# Patient Record
Sex: Male | Born: 1955 | Race: Black or African American | Hispanic: No | Marital: Single | Smoking: Current some day smoker
Health system: Southern US, Community
[De-identification: ages and names within clinical notes are randomized; demographics above are authoritative.]

## PROBLEM LIST (undated history)

## (undated) DIAGNOSIS — K219 Gastro-esophageal reflux disease without esophagitis: Secondary | ICD-10-CM

## (undated) HISTORY — PX: HERNIA REPAIR: SHX51

---

## 2017-02-11 ENCOUNTER — Encounter (HOSPITAL_COMMUNITY): Payer: Self-pay | Admitting: Emergency Medicine

## 2017-02-11 DIAGNOSIS — Y929 Unspecified place or not applicable: Secondary | ICD-10-CM | POA: Insufficient documentation

## 2017-02-11 DIAGNOSIS — Y939 Activity, unspecified: Secondary | ICD-10-CM | POA: Insufficient documentation

## 2017-02-11 DIAGNOSIS — Y999 Unspecified external cause status: Secondary | ICD-10-CM | POA: Diagnosis not present

## 2017-02-11 DIAGNOSIS — F17200 Nicotine dependence, unspecified, uncomplicated: Secondary | ICD-10-CM | POA: Diagnosis not present

## 2017-02-11 DIAGNOSIS — X58XXXA Exposure to other specified factors, initial encounter: Secondary | ICD-10-CM | POA: Insufficient documentation

## 2017-02-11 DIAGNOSIS — S3992XA Unspecified injury of lower back, initial encounter: Secondary | ICD-10-CM | POA: Diagnosis present

## 2017-02-11 DIAGNOSIS — S39012A Strain of muscle, fascia and tendon of lower back, initial encounter: Secondary | ICD-10-CM | POA: Diagnosis not present

## 2017-02-11 LAB — CBC WITH DIFFERENTIAL/PLATELET
BASOS ABS: 0 10*3/uL (ref 0.0–0.1)
BASOS PCT: 0 %
Eosinophils Absolute: 0.3 10*3/uL (ref 0.0–0.7)
Eosinophils Relative: 3 %
HEMATOCRIT: 42.5 % (ref 39.0–52.0)
HEMOGLOBIN: 14 g/dL (ref 13.0–17.0)
Lymphocytes Relative: 25 %
Lymphs Abs: 2.2 10*3/uL (ref 0.7–4.0)
MCH: 29.4 pg (ref 26.0–34.0)
MCHC: 32.9 g/dL (ref 30.0–36.0)
MCV: 89.1 fL (ref 78.0–100.0)
MONOS PCT: 9 %
Monocytes Absolute: 0.8 10*3/uL (ref 0.1–1.0)
NEUTROS ABS: 5.4 10*3/uL (ref 1.7–7.7)
NEUTROS PCT: 63 %
Platelets: 257 10*3/uL (ref 150–400)
RBC: 4.77 MIL/uL (ref 4.22–5.81)
RDW: 13.8 % (ref 11.5–15.5)
WBC: 8.5 10*3/uL (ref 4.0–10.5)

## 2017-02-11 LAB — BASIC METABOLIC PANEL
ANION GAP: 9 (ref 5–15)
BUN: 12 mg/dL (ref 6–20)
CHLORIDE: 104 mmol/L (ref 101–111)
CO2: 25 mmol/L (ref 22–32)
Calcium: 9.1 mg/dL (ref 8.9–10.3)
Creatinine, Ser: 1.07 mg/dL (ref 0.61–1.24)
GFR calc non Af Amer: >60 mL/min (ref >60)
Glucose, Bld: 103 mg/dL — ABNORMAL HIGH (ref 65–99)
Potassium: 4.5 mmol/L (ref 3.5–5.1)
SODIUM: 138 mmol/L (ref 135–145)

## 2017-02-11 NOTE — ED Triage Notes (Signed)
Patient reports right lower back pain radiating down to right upper thigh , denies injury , pain increases with movement / changing positions with intermittent mild dysuria . No hematuria .

## 2017-02-12 ENCOUNTER — Emergency Department (HOSPITAL_COMMUNITY): Payer: Medicare Other

## 2017-02-12 ENCOUNTER — Emergency Department (HOSPITAL_COMMUNITY)
Admission: EM | Admit: 2017-02-12 | Discharge: 2017-02-12 | Disposition: A | Discharge status: Home / Self Care | Payer: Medicare Other | Attending: Emergency Medicine | Admitting: Emergency Medicine

## 2017-02-12 DIAGNOSIS — S39012A Strain of muscle, fascia and tendon of lower back, initial encounter: Secondary | ICD-10-CM

## 2017-02-12 LAB — URINALYSIS, ROUTINE W REFLEX MICROSCOPIC
BILIRUBIN URINE: NEGATIVE
Glucose, UA: NEGATIVE mg/dL
Hgb urine dipstick: NEGATIVE
Ketones, ur: NEGATIVE mg/dL
Leukocytes, UA: NEGATIVE
Nitrite: NEGATIVE
PROTEIN: NEGATIVE mg/dL
Specific Gravity, Urine: 1.02 (ref 1.005–1.030)
pH: 5 (ref 5.0–8.0)

## 2017-02-12 MED ORDER — ONDANSETRON 4 MG PO TBDP
4.0000 mg | ORAL_TABLET | Freq: Once | ORAL | Status: AC
  Administered 2017-02-12: 4 mg via ORAL
  Filled 2017-02-12: qty 1

## 2017-02-12 MED ORDER — OXYCODONE-ACETAMINOPHEN 5-325 MG PO TABS: 1.0000 | ORAL_TABLET | Freq: Four times a day (QID) | ORAL | 0 refills | Status: AC | PRN

## 2017-02-12 MED ORDER — BACLOFEN 10 MG PO TABS
10.0000 mg | ORAL_TABLET | Freq: Once | ORAL | Status: AC
  Administered 2017-02-12: 10 mg via ORAL
  Filled 2017-02-12: qty 1

## 2017-02-12 MED ORDER — OXYCODONE-ACETAMINOPHEN 5-325 MG PO TABS
2.0000 | ORAL_TABLET | Freq: Once | ORAL | Status: AC
  Administered 2017-02-12: 2 via ORAL
  Filled 2017-02-12: qty 2

## 2017-02-12 MED ORDER — BACLOFEN 10 MG PO TABS: 10.0000 mg | ORAL_TABLET | Freq: Three times a day (TID) | ORAL | 0 refills | Status: AC

## 2017-02-12 MED ORDER — MELOXICAM 15 MG PO TABS: 15.0000 mg | ORAL_TABLET | Freq: Every day | ORAL | 0 refills | Status: DC

## 2017-02-12 NOTE — ED Provider Notes (Signed)
[facility] Provider Note   CSN: [id number] Arrival date & time: 02/11/17  1958     History   Chief Complaint Chief Complaint  Patient presents with  . Back Pain    HPI Raymond Daniels is a 61 y.o. male who presents emergency fellow chief complaint of back pain. Patient states that 2 days ago, he was showing his son and exercise where he would twist at the waist when he had sudden onset of severe gripping lower back pain. He states that since that time. He cannot find a comfortable position including lying down or standing. He states that at times the pain grips him with certain movements and radiates down his back and around the front of both of his legs. He denies weakness, numbness or tingling, loss of bowel or bladder control. He has not taken any medication for the pain  HPI  History reviewed. No pertinent past medical history.  There are no active problems to display for this patient.   Past Surgical History:  Procedure Laterality Date  . HERNIA REPAIR         Home Medications    Prior to Admission medications   Not on File    Family History No family history on file.  Social History Social History  Substance Use Topics  . Smoking status: Current Every Day Smoker  . Smokeless tobacco: Never Used  . Alcohol use Yes     Allergies   Patient has no known allergies.   Review of Systems Review of Systems  Ten systems reviewed and are negative for acute change, except as noted in the HPI. \ Physical Exam Updated Vital Signs BP 110/78   Pulse 83   Temp 98.6 F (37 C) (Oral)   Resp 18   Ht 6\' 1"  (1.854 m)   Wt 114.3 kg   SpO2 100%   BMI 33.25 kg/m   Physical Exam  Constitutional: He appears well-developed and well-nourished. No distress.  HENT:  Head: Normocephalic and atraumatic.  Eyes: Conjunctivae are normal. No scleral icterus.  Neck: Normal range of motion. Neck supple.  Cardiovascular: Normal rate, regular rhythm and normal heart  sounds.   Pulmonary/Chest: Effort normal and breath sounds normal. No respiratory distress.  Abdominal: Soft. There is no tenderness.  Genitourinary:  Genitourinary Comments:    Musculoskeletal: He exhibits no edema.  Patient appears to be in mild to moderate pain, antalgic gait noted. Lumbosacral spine area reveals no local tenderness or mass. Painful and reduced LS ROM noted. Straight leg raise is negative. DTR's, motor strength and sensation normal, including heel and toe gait.  Peripheral pulses are palpable.  Neurological: He is alert.  Skin: Skin is warm and dry. He is not diaphoretic.  Psychiatric: His behavior is normal.  Nursing note and vitals reviewed.    ED Treatments / Results  Labs (all labs ordered are listed, but only abnormal results are displayed) Labs Reviewed  BASIC METABOLIC PANEL - Abnormal; Notable for the following:       Result Value   Glucose, Bld 103 (*)    All other components within normal limits  CBC WITH DIFFERENTIAL/PLATELET  URINALYSIS, ROUTINE W REFLEX MICROSCOPIC    EKG  EKG Interpretation None       Radiology No results found.  Procedures Procedures (including critical care time)  Medications Ordered in ED Medications - No data to display   Initial Impression / Assessment and Plan / ED Course  I have reviewed the triage vital signs  and the nursing notes.  Pertinent labs & imaging results that were available during my care of the patient were reviewed by me and considered in my medical decision making (see chart for details).    Patient with back pain.  No neurological deficits and normal neuro exam.  Patient can walk but states is painful.  No loss of bowel or bladder control.  No concern for cauda equina.  No fever, night sweats, weight loss, h/o cancer, IVDU.  RICE protocol and pain medicine indicated and discussed with patient.   Final Clinical Impressions(s) / ED Diagnoses   Final diagnoses:  Strain of lumbar region,  initial encounter    New Prescriptions New Prescriptions   No medications on file     Arthor Captain, PA-C 02/12/17 1610    Tomasita Crumble, MD 02/12/17 [phone]

## 2017-02-12 NOTE — Discharge Instructions (Signed)
SEEK IMMEDIATE MEDICAL ATTENTION IF: New numbness, tingling, weakness, or problem with the use of your arms or legs.  Severe back pain not relieved with medications.  Change in bowel or bladder control.  Increasing pain in any areas of the body (such as chest or abdominal pain).  Shortness of breath, dizziness or fainting.  Nausea (feeling sick to your stomach), vomiting, fever, or sweats.  

## 2018-01-20 ENCOUNTER — Other Ambulatory Visit: Payer: Self-pay | Admitting: Surgery

## 2018-01-20 DIAGNOSIS — R1909 Other intra-abdominal and pelvic swelling, mass and lump: Secondary | ICD-10-CM

## 2018-02-04 ENCOUNTER — Ambulatory Visit
Admission: RE | Admit: 2018-02-04 | Discharge: 2018-02-04 | Disposition: A | Discharge status: Home / Self Care | Payer: Medicare Other | Source: Ambulatory Visit | Attending: Surgery | Admitting: Surgery

## 2018-02-04 DIAGNOSIS — R1909 Other intra-abdominal and pelvic swelling, mass and lump: Secondary | ICD-10-CM

## 2018-02-04 MED ORDER — IOPAMIDOL (ISOVUE-300) INJECTION 61%
100.0000 mL | Freq: Once | INTRAVENOUS | Status: AC | PRN
  Administered 2018-02-04: 100 mL via INTRAVENOUS

## 2019-03-01 ENCOUNTER — Other Ambulatory Visit: Payer: Self-pay | Admitting: Podiatry

## 2019-03-01 ENCOUNTER — Other Ambulatory Visit: Payer: Self-pay

## 2019-03-01 ENCOUNTER — Encounter: Payer: Self-pay | Admitting: Podiatry

## 2019-03-01 ENCOUNTER — Ambulatory Visit (INDEPENDENT_AMBULATORY_CARE_PROVIDER_SITE_OTHER): Payer: Medicare Other

## 2019-03-01 ENCOUNTER — Ambulatory Visit: Payer: Medicare Other | Admitting: Podiatry

## 2019-03-01 VITALS — BP 128/77 | HR 78 | Temp 98.2°F | Resp 16

## 2019-03-01 DIAGNOSIS — M7751 Other enthesopathy of right foot: Secondary | ICD-10-CM

## 2019-03-01 DIAGNOSIS — M7752 Other enthesopathy of left foot: Secondary | ICD-10-CM | POA: Diagnosis not present

## 2019-03-01 DIAGNOSIS — M79674 Pain in right toe(s): Secondary | ICD-10-CM | POA: Diagnosis not present

## 2019-03-01 DIAGNOSIS — M2041 Other hammer toe(s) (acquired), right foot: Secondary | ICD-10-CM

## 2019-03-01 DIAGNOSIS — M2042 Other hammer toe(s) (acquired), left foot: Principal | ICD-10-CM

## 2019-03-01 DIAGNOSIS — M79675 Pain in left toe(s): Secondary | ICD-10-CM

## 2019-03-01 DIAGNOSIS — M779 Enthesopathy, unspecified: Secondary | ICD-10-CM

## 2019-03-01 MED ORDER — TRIAMCINOLONE ACETONIDE 10 MG/ML IJ SUSP
10.0000 mg | Freq: Once | INTRAMUSCULAR | Status: AC
  Administered 2019-03-01: 10:00:00 10 mg

## 2019-03-01 MED ORDER — TRIAMCINOLONE ACETONIDE 10 MG/ML IJ SUSP
10.0000 mg | Freq: Once | INTRAMUSCULAR | Status: AC
  Administered 2019-03-01: 10 mg

## 2019-03-01 NOTE — Progress Notes (Signed)
   Subjective:    Patient ID: Raymond Daniels, male    DOB: [birth year], 63 y.o.   MRN: [id number]  HPI    Review of Systems  All other systems reviewed and are negative.      Objective:   Physical Exam        Assessment & Plan:

## 2019-03-01 NOTE — Progress Notes (Signed)
Subjective:   Patient ID: Raymond Daniels, male   DOB: 63 y.o.   MRN: [id number]   HPI Patient presents stating that she is getting a lot of pain around her toes and is been going on a long time and is worsened over time.  Also states that it seems the big toe joint can become bothersome and states that both of them have become increasingly aggravating and at times he also seems to develop pain in his ankles.  Patient smokes occasionally does not drink and is in reasonably good health  Review of Systems  All other systems reviewed and are negative.       Objective:  Physical Exam Vitals signs and nursing note reviewed.  Constitutional:      Appearance: He is well-developed.  Pulmonary:     Effort: Pulmonary effort is normal.  Musculoskeletal: Normal range of motion.  Skin:    General: Skin is warm.  Neurological:     Mental Status: He is alert.     Neurovascular status intact muscle strength is found to be adequate range of motion is within normal limits except for subtalar motion which is moderately diminished.  There is quite a bit of discomfort around the lesser MPJs bilateral with inflammation noted especially around the first and second MPJ with no restriction of motion and moderate hammertoe deformity noted bilateral with moderate rigid contracture.  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

## 2019-03-01 NOTE — Patient Instructions (Signed)
Hammer Toe  Hammer toe is a change in the shape (a deformity) of your toe. The deformity causes the middle joint of your toe to stay bent. This causes pain, especially when you are wearing shoes. Hammer toe starts gradually. At first, the toe can be straightened. Gradually over time, the deformity becomes stiff and permanent. Early treatments to keep the toe straight may relieve pain. As the deformity becomes stiff and permanent, surgery may be needed to straighten the toe. What are the causes? Hammer toe is caused by abnormal bending of the toe joint that is closest to your foot. It happens gradually over time. This pulls on the muscles and connections (tendons) of the toe joint, making them weak and stiff. It is often related to wearing shoes that are too short or narrow and do not let your toes straighten. What increases the risk? You may be at greater risk for hammer toe if you:  Are male.  Are older.  Wear shoes that are too small.  Wear high-heeled shoes that pinch your toes.  Are a ballet dancer.  Have a second toe that is longer than your big toe (first toe).  Injure your foot or toe.  Have arthritis.  Have a family history of hammer toe.  Have a nerve or muscle disorder. What are the signs or symptoms? The main symptoms of this condition are pain and deformity of the toe. The pain is worse when wearing shoes, walking, or running. Other symptoms may include:  Corns or calluses over the bent part of the toe or between the toes.  Redness and a burning feeling on the toe.  An open sore that forms on the top of the toe.  Not being able to straighten the toe. How is this diagnosed? This condition is diagnosed based on your symptoms and a physical exam. During the exam, your health care provider will try to straighten your toe to see how stiff the deformity is. You may also have tests, such as:  A blood test to check for rheumatoid arthritis.  An X-ray to show how  severe the deformity is. How is this treated? Treatment for this condition will depend on how stiff the deformity is. Surgery is often needed. However, sometimes a hammer toe can be straightened without surgery. Treatments that do not involve surgery include:  Taping the toe into a straightened position.  Using pads and cushions to protect the toe (orthotics).  Wearing shoes that provide enough room for the toes.  Doing toe-stretching exercises at home.  Taking an NSAID to reduce pain and swelling. If these treatments do not help or the toe cannot be straightened, surgery is the next option. The most common surgeries used to straighten a hammer toe include:  Arthroplasty. In this procedure, part of the joint is removed, and that allows the toe to straighten.  Fusion. In this procedure, cartilage between the two bones of the joint is taken out and the bones are fused together into one longer bone.  Implantation. In this procedure, part of the bone is removed and replaced with an implant to let the toe move again.  Flexor tendon transfer. In this procedure, the tendons that curl the toes down (flexor tendons) are repositioned. Follow these instructions at home:  Take over-the-counter and prescription medicines only as told by your health care provider.  Do toe straightening and stretching exercises as told by your health care provider.  Keep all follow-up visits as told by your health care   provider. This is important. How is this prevented?  Wear shoes that give your toes enough room and do not cause pain.  Do not wear high-heeled shoes. Contact a health care provider if:  Your pain gets worse.  Your toe becomes red or swollen.  You develop an open sore on your toe. This information is not intended to replace advice given to you by your health care provider. Make sure you discuss any questions you have with your health care provider. Document Released: 11/08/2000 Document  Revised: 06/09/2017 Document Reviewed: 03/06/2016 Elsevier Interactive Patient Education  2019 [employer]  

## 2019-05-03 ENCOUNTER — Ambulatory Visit: Payer: Medicare Other | Admitting: Podiatry

## 2019-05-03 ENCOUNTER — Encounter: Payer: Self-pay | Admitting: Podiatry

## 2019-05-03 ENCOUNTER — Other Ambulatory Visit: Payer: Self-pay

## 2019-05-03 VITALS — Temp 98.2°F

## 2019-05-03 DIAGNOSIS — M779 Enthesopathy, unspecified: Secondary | ICD-10-CM | POA: Diagnosis not present

## 2019-05-03 DIAGNOSIS — G629 Polyneuropathy, unspecified: Secondary | ICD-10-CM

## 2019-05-03 MED ORDER — TRIAMCINOLONE ACETONIDE 10 MG/ML IJ SUSP
10.0000 mg | Freq: Once | INTRAMUSCULAR | Status: AC
  Administered 2019-05-03: 10 mg

## 2019-05-03 MED ORDER — GABAPENTIN 400 MG PO CAPS: 400.0000 mg | ORAL_CAPSULE | Freq: Three times a day (TID) | ORAL | 5 refills | Status: AC

## 2019-05-03 NOTE — Progress Notes (Signed)
Subjective:   Patient ID: Raymond Daniels, male   DOB: 63 y.o.   MRN: [id number]   HPI Patient presents stating getting a lot of pain in the joints the ones that were worked on her some better now it has the other ones and I just get generalized pain in my feet   ROS      Objective:  Physical Exam  Neurovascular status intact with patient does have diminished sharp dull vibratory does have some swelling into the ankle and midfoot bilateral.  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  Educated him on gabapentin and neuropathy and he will be seen back for [place] to recheck again depending on symptoms

## 2019-07-26 ENCOUNTER — Ambulatory Visit: Payer: Medicare Other | Admitting: Podiatry

## 2020-01-03 ENCOUNTER — Ambulatory Visit
Admission: RE | Admit: 2020-01-03 | Discharge: 2020-01-03 | Disposition: A | Discharge status: Home / Self Care | Payer: Medicare HMO | Source: Ambulatory Visit | Attending: Family Medicine | Admitting: Family Medicine

## 2020-01-03 ENCOUNTER — Other Ambulatory Visit: Payer: Self-pay

## 2020-01-03 ENCOUNTER — Other Ambulatory Visit: Payer: Self-pay | Admitting: Family Medicine

## 2020-01-03 DIAGNOSIS — M25512 Pain in left shoulder: Secondary | ICD-10-CM

## 2020-01-03 DIAGNOSIS — M545 Low back pain, unspecified: Secondary | ICD-10-CM

## 2020-01-03 DIAGNOSIS — M542 Cervicalgia: Secondary | ICD-10-CM

## 2020-01-03 DIAGNOSIS — M25511 Pain in right shoulder: Secondary | ICD-10-CM

## 2020-01-03 DIAGNOSIS — M25531 Pain in right wrist: Secondary | ICD-10-CM

## 2020-01-17 ENCOUNTER — Other Ambulatory Visit: Payer: Self-pay | Admitting: Family Medicine

## 2020-01-17 DIAGNOSIS — M8580 Other specified disorders of bone density and structure, unspecified site: Secondary | ICD-10-CM

## 2020-03-22 ENCOUNTER — Other Ambulatory Visit: Payer: Self-pay | Admitting: Family Medicine

## 2020-03-22 ENCOUNTER — Ambulatory Visit
Admission: RE | Admit: 2020-03-22 | Discharge: 2020-03-22 | Disposition: A | Discharge status: Home / Self Care | Payer: Medicare HMO | Source: Ambulatory Visit | Attending: Family Medicine | Admitting: Family Medicine

## 2020-03-22 DIAGNOSIS — S0990XA Unspecified injury of head, initial encounter: Secondary | ICD-10-CM

## 2020-03-22 DIAGNOSIS — W19XXXA Unspecified fall, initial encounter: Secondary | ICD-10-CM

## 2020-03-27 ENCOUNTER — Other Ambulatory Visit: Payer: Self-pay

## 2020-03-27 ENCOUNTER — Encounter (HOSPITAL_COMMUNITY): Payer: Self-pay

## 2020-03-27 ENCOUNTER — Ambulatory Visit
Admission: RE | Admit: 2020-03-27 | Discharge: 2020-03-27 | Disposition: A | Discharge status: Home / Self Care | Payer: Medicare HMO | Source: Ambulatory Visit | Attending: Family Medicine | Admitting: Family Medicine

## 2020-03-27 ENCOUNTER — Ambulatory Visit (HOSPITAL_COMMUNITY)
Admission: EM | Admit: 2020-03-27 | Discharge: 2020-03-27 | Disposition: A | Discharge status: Home / Self Care | Payer: Medicare HMO

## 2020-03-27 DIAGNOSIS — S0181XA Laceration without foreign body of other part of head, initial encounter: Secondary | ICD-10-CM | POA: Diagnosis not present

## 2020-03-27 DIAGNOSIS — M542 Cervicalgia: Secondary | ICD-10-CM | POA: Diagnosis not present

## 2020-03-27 DIAGNOSIS — W19XXXA Unspecified fall, initial encounter: Secondary | ICD-10-CM | POA: Diagnosis not present

## 2020-03-27 DIAGNOSIS — S0083XA Contusion of other part of head, initial encounter: Secondary | ICD-10-CM | POA: Diagnosis not present

## 2020-03-27 DIAGNOSIS — S0990XA Unspecified injury of head, initial encounter: Secondary | ICD-10-CM

## 2020-03-27 HISTORY — DX: Gastro-esophageal reflux disease without esophagitis: K21.9

## 2020-03-27 NOTE — Discharge Instructions (Signed)
Your head swelling should go down over time.   Call your Primary care to schedule follow up this week for re-evaluation  If you feel faint, have numbness or weakness, chest pain, shortness of breath please go to the Emergency department

## 2020-03-27 NOTE — ED Triage Notes (Signed)
Pt Possibly LOC and fell in bathroom and hit headx8 days ago. Pt c/o 8/10 pain in right forehead. Pt c/o 8/10 posterior left neck pain. Pt denies N/V, vision changes since fall. Pt denies blood thinners. Pt able to move all extremities.

## 2020-03-27 NOTE — ED Provider Notes (Signed)
[facility]    CSN: [id number] Arrival date & time: 03/27/20  7619      History   Chief Complaint Chief Complaint  Patient presents with  . Fall    HPI Raymond Daniels is a 64 y.o. male.   Patient reports for evaluation of hematoma and frontal head pain.  Reports falling 6 days ago when in the bathroom,  He reports standing looking the mirror and next thing he knew he had fallen and hit his head.  He is unsure why he fell, however does not believe he was down long and got up very quickly after falling.  Denies any chest pain or shortness of breath when this occurred.  Denies feeling lightheaded.  He reports other falls similar this previously where he would fall asleep while on the toilet.  He reports he may have been very sleepy, as he has not been sleeping well lately.  He reports calling his primary care the next day to discuss evaluation and a CT scan was ordered.  He obtained this today 03/27/2020 Prior to arrival in urgent care.  In urgent care for complaints of frontal head pain at the area of injury and some left-sided neck pain.    In clinic he is only complaining of pain around the area where he hit his head and some pain in the left side of his neck.  His son brought him to urgent care as they were concerned about the swelling that continued around the area in his head.  He denies any changes in his vision, lightheaded feeling, dizziness, nausea or vomiting throughout the duration following the fall.  Denies any upper extremity weakness that is new.  Denies any numbness or tingling in his upper extremities.   Patient also reports that for some time now he has had unstable gait.  He denies that this is significantly worse over the last week.     Past Medical History:  Diagnosis Date  . GERD (gastroesophageal reflux disease)     There are no problems to display for this patient.   Past Surgical History:  Procedure Laterality Date  . HERNIA REPAIR          Home Medications    Prior to Admission medications   Medication Sig Start Date End Date Taking? Authorizing Provider  baclofen (LIORESAL) 10 MG tablet Take 1 tablet (10 mg total) by mouth 3 (three) times daily. 02/12/17   Harris, Abigail, PA-C  cyclobenzaprine (FLEXERIL) 10 MG tablet TAKE 1 TABLET BY MOUTH TWICE DAILY AS NEEDED FOR MUSCLE SPASM 02/08/19   [provider]  diclofenac (VOLTAREN) 75 MG EC tablet Take 75 mg by mouth 2 (two) times daily.    [provider]  diclofenac sodium (VOLTAREN) 1 % GEL Apply topically 4 (four) times daily.    [provider]  gabapentin (NEURONTIN) 400 MG capsule Take 1 capsule (400 mg total) by mouth 3 (three) times daily. 05/03/19   Lenn Sink, DPM  omeprazole (PRILOSEC) 40 MG capsule TAKE 1 CAPSULE BY MOUTH ONCE DAILY BEFORE A MEAL 02/08/19   [provider]  omeprazole (PRILOSEC) 40 MG capsule  02/08/19   [provider]  oxyCODONE-acetaminophen (PERCOCET) 5-325 MG tablet Take 1 tablet by mouth every 6 (six) hours as needed. 02/12/17   Arthor Captain, PA-C    Family History Family History  Problem Relation Age of Onset  . Cancer Mother   . Cancer Sister     Social History Social History  Tobacco Use  . Smoking status: Current Some Day Smoker    Types: Cigars  . Smokeless tobacco: Never Used  Substance Use Topics  . Alcohol use: Yes    Comment: occ  . Drug use: No     Allergies   Patient has no known allergies.   Review of Systems Review of Systems  Per HPI Physical Exam Triage Vital Signs ED Triage Vitals [03/27/20 0912]  Enc Vitals Group     BP (!) 147/77     Pulse Rate 80     Resp 16     Temp 98.1 F (36.7 C)     Temp Source Oral     SpO2 97 %     Weight 260 lb (117.9 kg)     Height 6\' 1"  (1.854 m)     Head Circumference      Peak Flow      Pain Score 8     Pain Loc      Pain Edu?      Excl. in GC?    No data found.  Updated Vital Signs BP (!) 147/77   Pulse 80    Temp 98.1 F (36.7 C) (Oral)   Resp 16   Ht 6\' 1"  (1.854 m)   Wt 260 lb (117.9 kg)   SpO2 97%   BMI 34.30 kg/m   Visual Acuity Right Eye Distance:   Left Eye Distance:   Bilateral Distance:    Right Eye Near:   Left Eye Near:    Bilateral Near:     Physical Exam Vitals and nursing note reviewed.  Constitutional:      Appearance: He is well-developed.  HENT:     Head: Normocephalic.     Comments: Small hematoma with well-healing wound over the right upper forehead.  Minimal tenderness to palpation.  No crepitus. Eyes:     Conjunctiva/sclera: Conjunctivae normal.  Neck:     Comments: There is some tenderness to palpation over the left-sided paraspinals.  Patient does have range of motion to around 45 degrees of rotation each way..  Able to flex and extend the neck. Cardiovascular:     Rate and Rhythm: Normal rate and regular rhythm.     Heart sounds: No murmur.  Pulmonary:     Effort: Pulmonary effort is normal. No respiratory distress.     Breath sounds: Normal breath sounds.  Abdominal:     Palpations: Abdomen is soft.     Tenderness: There is no abdominal tenderness.  Musculoskeletal:     Cervical back: Neck supple. No rigidity.  Skin:    General: Skin is warm and dry.     Capillary Refill: Capillary refill takes less than 2 seconds.     Findings: No bruising or rash.  Neurological:     General: No focal deficit present.     Mental Status: He is alert and oriented to person, place, and time.     Cranial Nerves: No cranial nerve deficit.     Sensory: No sensory deficit.     Coordination: Coordination normal.     Gait: Gait normal.     Comments: There is some decreased grip strength in the right hand versus left, patient reports is not new.  Mild decrease in strength of extension of the elbow on the left versus right.  5/5 versus 4/5.  Flexion of the elbow equal bilaterally at 5/5.  Strength in shoulder is 5/5 equally bilaterally.      UC Treatments /  Results  Labs (all labs ordered are listed, but only abnormal results are displayed) Labs Reviewed - No data to display  EKG   Radiology CT HEAD WO CONTRAST  Result Date: 03/27/2020 CLINICAL DATA:  Fall with head injury EXAM: CT HEAD WITHOUT CONTRAST TECHNIQUE: Contiguous axial images were obtained from the base of the skull through the vertex without intravenous contrast. COMPARISON:  None. FINDINGS: Brain: No evidence of acute infarction, hemorrhage, hydrocephalus, extra-axial collection or mass lesion/mass effect. Vascular: No hyperdense vessel or unexpected calcification. Skull: Normal. Negative for fracture or focal lesion. Incidental periapical dental erosions. Sinuses/Orbits: No evidence of injury IMPRESSION: No evidence of intracranial injury. Electronically Signed   By: Monte Fantasia M.D.   On: 03/27/2020 08:55    Procedures Procedures (including critical care time)  Medications Ordered in UC Medications - No data to display  Initial Impression / Assessment and Plan / UC Course  I have reviewed the triage vital signs and the nursing notes.  Pertinent labs & imaging results that were available during my care of the patient were reviewed by me and considered in my medical decision making (see chart for details).     #Fall #Head contusion with small wound #Neck pain Patient is a 64 year old presenting for evaluation after fall 6 days ago.  CT scan report shows no intracranial pathology.  No evidence of skull fracture.  Patient's wound on head appears well healing without sign of infection.  His mental status is stable does not appear to have any focal neurologic deficits.  Given duration symptoms symptoms and patient is still ambulatory without significant neurologic findings do not believe there was any cervical spine injury and will defer imaging at this time.  Discussed the patient needs to continue to follow-up with his primary care for further evaluation and monitoring.   Strict emergency department precautions were discussed such as patient had feeling faint, numbness weakness chest pain or shortness of breath that he should go to the emergency department.  Patient verbalized understanding.   Final Clinical Impressions(s) / UC Diagnoses   Final diagnoses:  Fall, initial encounter  Contusion of other part of head, initial encounter  Neck pain  Facial laceration, initial encounter     Discharge Instructions     Your head swelling should go down over time.   Call your Primary care to schedule follow up this week for re-evaluation  If you feel faint, have numbness or weakness, chest pain, shortness of breath please go to the Emergency department      ED Prescriptions    None     PDMP not reviewed this encounter.   Purnell Shoemaker, PA-C 03/27/20 2357

## 2020-03-31 ENCOUNTER — Other Ambulatory Visit: Payer: Medicare HMO

## 2020-05-23 ENCOUNTER — Ambulatory Visit (INDEPENDENT_AMBULATORY_CARE_PROVIDER_SITE_OTHER): Payer: Medicare HMO | Admitting: Pulmonary Disease

## 2020-05-23 ENCOUNTER — Other Ambulatory Visit: Payer: Self-pay

## 2020-05-23 ENCOUNTER — Encounter: Payer: Self-pay | Admitting: Pulmonary Disease

## 2020-05-23 DIAGNOSIS — M47812 Spondylosis without myelopathy or radiculopathy, cervical region: Secondary | ICD-10-CM

## 2020-05-23 DIAGNOSIS — G4733 Obstructive sleep apnea (adult) (pediatric): Secondary | ICD-10-CM | POA: Diagnosis not present

## 2020-05-23 NOTE — Assessment & Plan Note (Signed)
He may have OSA but this does not seem to be the main issue at this time.  Also, he is not interested in starting CPAP therapy at this time.  So we will hold off further investigating this. His main issue seems to be insomnia which is related to his neck and back pain, this deserves more investigation at this time. He can trial melatonin for his insomnia but this does seem to be related to circadian rhythm issues and chronic pain.

## 2020-05-23 NOTE — Assessment & Plan Note (Signed)
He will call his  medical doctor and ask them about MRI of neck He is having some grip issues and x-ray C-spine did show evidence of C4-T1 cervical spondylosis This pain may be directly impacting his sleep
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
## 2020-05-23 NOTE — Patient Instructions (Signed)
Call your medical doctor and ask them about MRI of your neck  See if your sleep gets better once your pain is better controlled Okay to trial melatonin 10 mg around midnight to see if this helps you sleep better.  If sleep problems persist call us back and we can schedule sleep study

## 2020-06-02 ENCOUNTER — Other Ambulatory Visit: Payer: Self-pay | Admitting: Family Medicine

## 2020-06-09 ENCOUNTER — Other Ambulatory Visit: Payer: Medicare HMO

## 2020-06-19 ENCOUNTER — Other Ambulatory Visit: Payer: Medicare HMO

## 2020-10-02 ENCOUNTER — Other Ambulatory Visit: Payer: Medicare PPO

## 2021-02-06 IMAGING — CT CT HEAD W/O CM
4 series · 17 of 47 positions shown, 19 images · non-contrast
Comparison: None.

CLINICAL DATA: Fall with head injury

EXAM:
CT HEAD WITHOUT CONTRAST
TECHNIQUE: Contiguous axial images were obtained from the base of the skull
through the vertex without intravenous contrast.

[Series 2: head 5.00 hr40 s3 axial ibhc · axial · 0.53mm/px · z∈[-599,-474]mm · 6 of 37 slices shown, 8 images]
[im 6/37  brain]
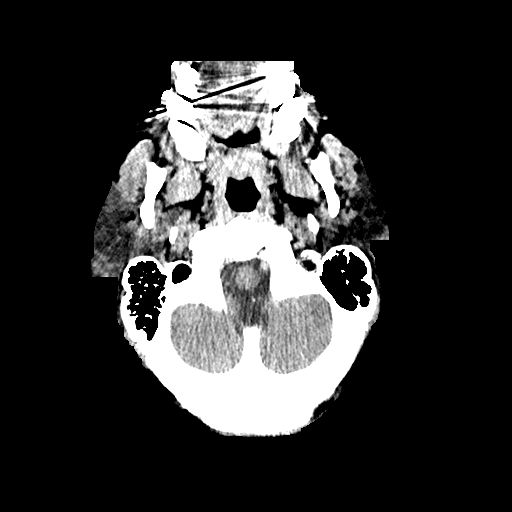
[im 6/37  bone]
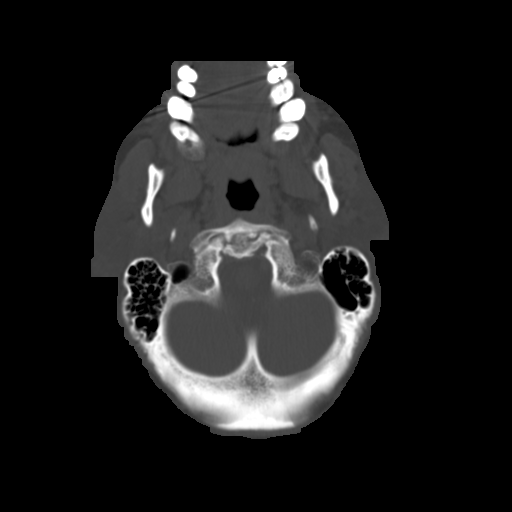
[im 11/37  brain]
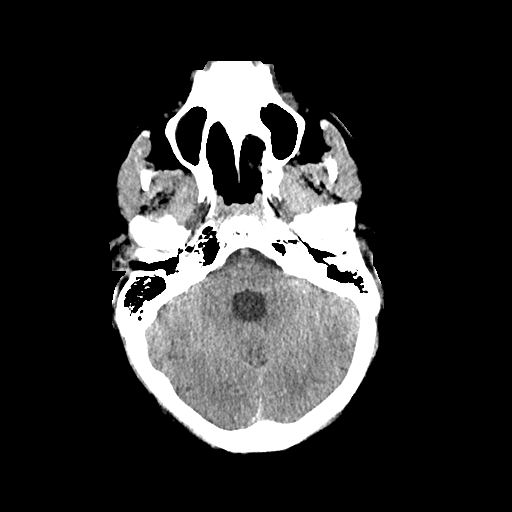
[im 16/37  brain]
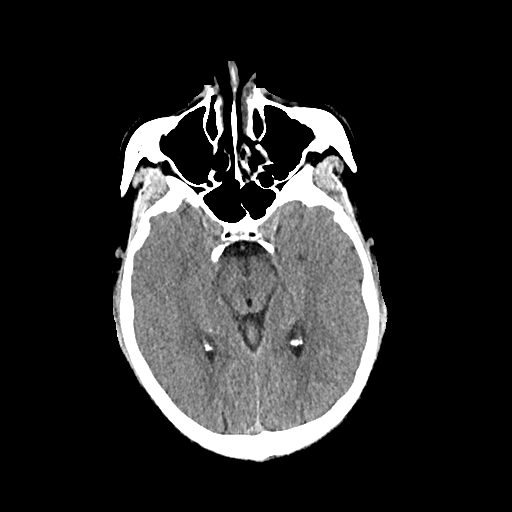
[im 21/37  brain]
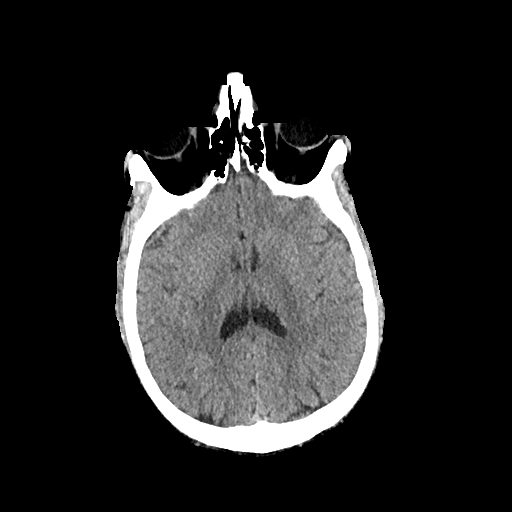
[im 26/37  brain]
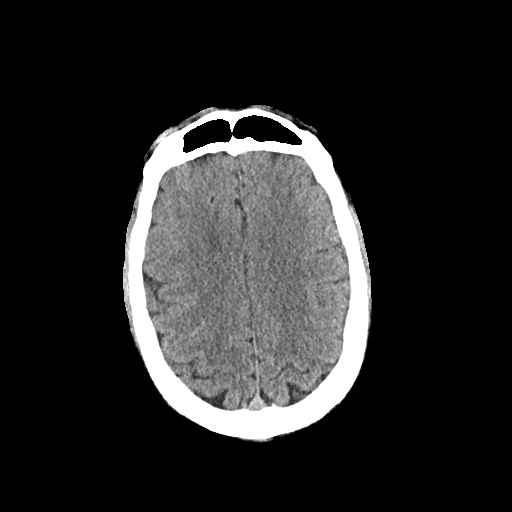
[im 26/37  bone]
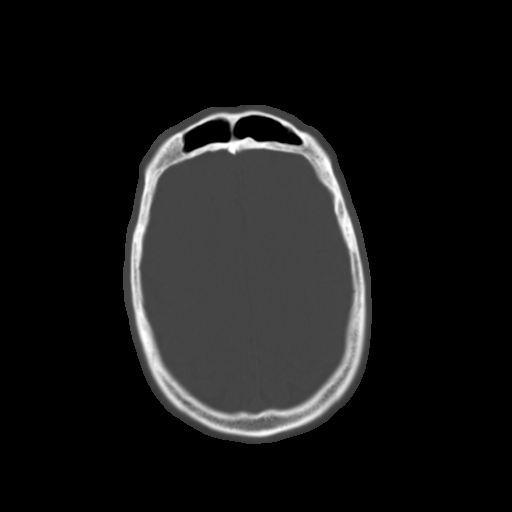
[im 31/37  brain]
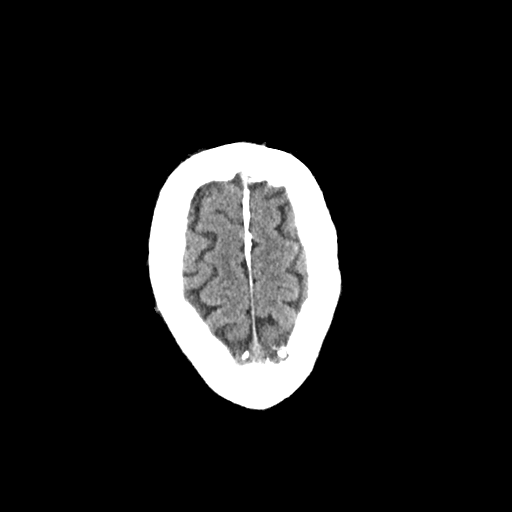

[Series 3: head 2.00 hr60 s3 axial bone · axial · 0.52mm/px · z∈[-609,-519]mm · 5 of 94 slices shown]
[im 9/94  bone]
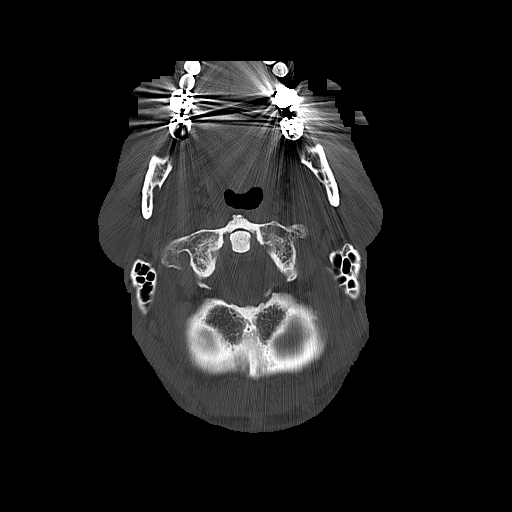
[im 18/94  bone]
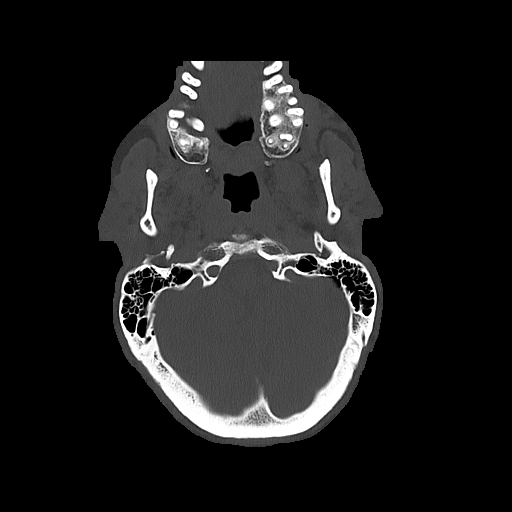
[im 32/94  bone]
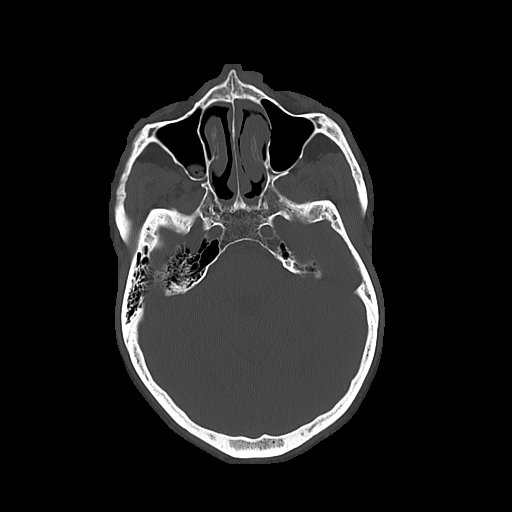
[im 40/94  bone]
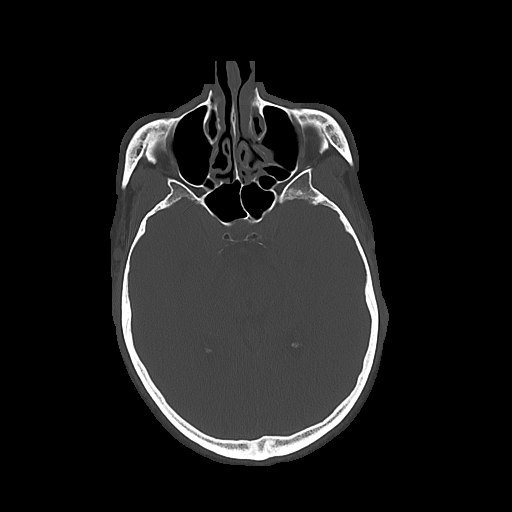
[im 54/94  bone]
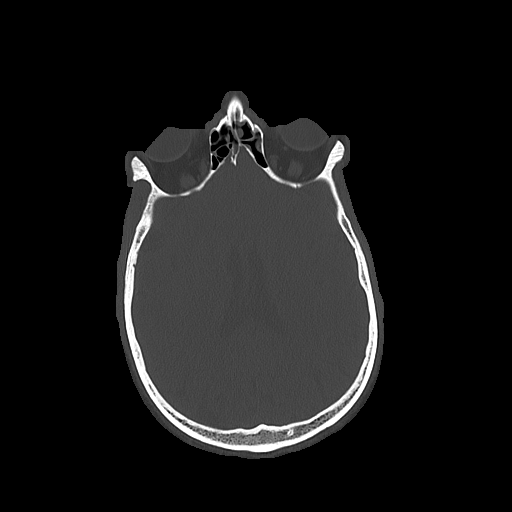

[Series 4: head 3.00 hr40 s3 sag · sagittal · 0.39mm/px · 3 of 96 slices shown]
[im 32/96  brain]
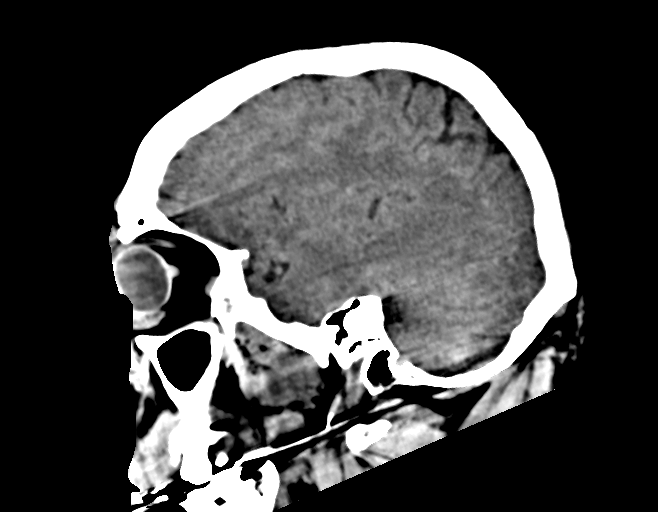
[im 48/96  brain]
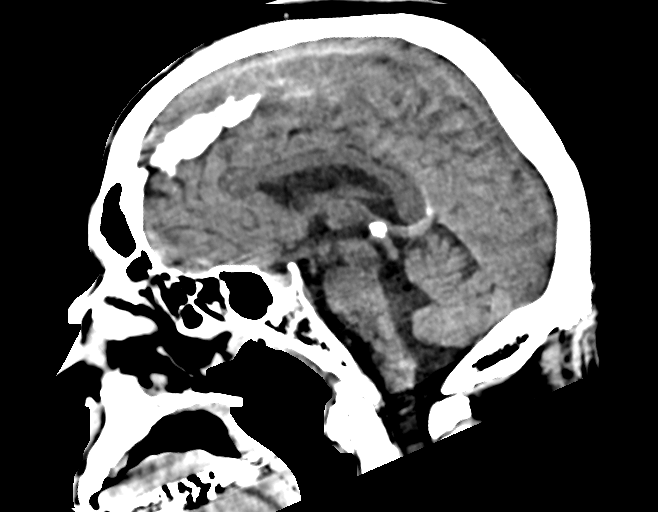
[im 64/96  brain]
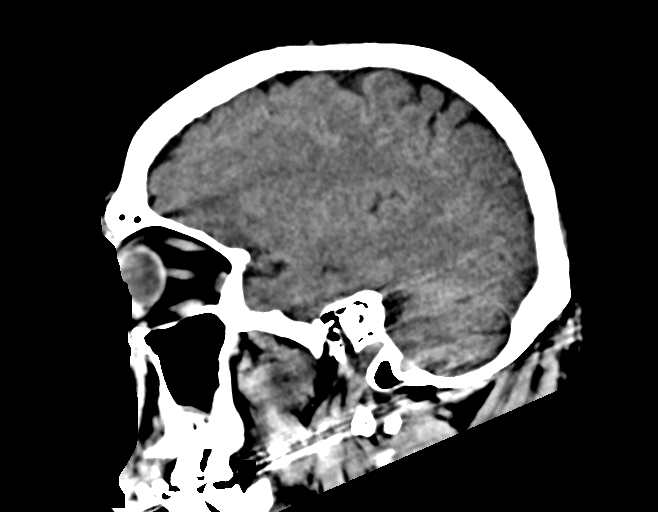

[Series 6: head 3.00 hr40 s3 cor · coronal · 0.37mm/px · 3 of 125 slices shown]
[im 46/125  brain]
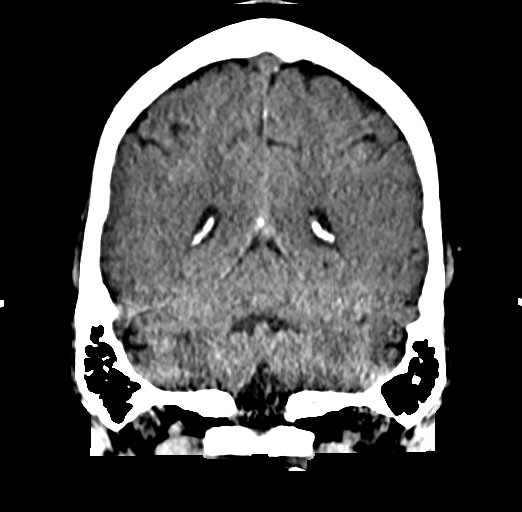
[im 57/125  brain]
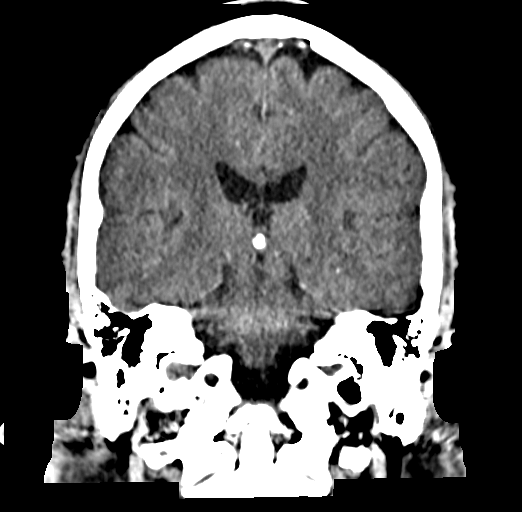
[im 68/125  brain]
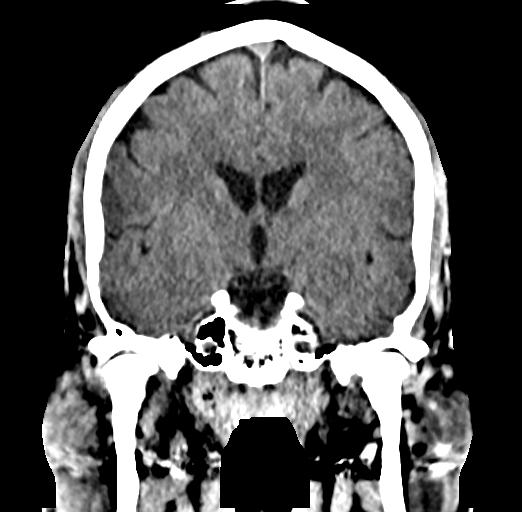

[17 of 47 positions shown; findings below may reference images not displayed]

FINDINGS: Brain: No evidence of acute infarction, hemorrhage, hydrocephalus,
extra-axial collection or mass lesion/mass effect.

Vascular: No hyperdense vessel or unexpected calcification.

Skull: Normal. Negative for fracture or focal lesion. Incidental
periapical dental erosions.

Sinuses/Orbits: No evidence of injury
IMPRESSION: No evidence of intracranial injury.

## 2022-01-23 DEATH — deceased
